# Patient Record
Sex: Female | Born: 1950 | Race: White | Hispanic: No | Marital: Married | Smoking: Never smoker
Health system: Southern US, Community
[De-identification: ages and names within clinical notes are randomized; demographics above are authoritative.]

---

## 1993-01-15 HISTORY — PX: ABDOMINAL HYSTERECTOMY: SHX81

## 1997-05-12 ENCOUNTER — Other Ambulatory Visit: Admission: RE | Admit: 1997-05-12 | Discharge: 1997-05-12 | Payer: Self-pay | Admitting: Obstetrics and Gynecology

## 1998-07-25 ENCOUNTER — Other Ambulatory Visit: Admission: RE | Admit: 1998-07-25 | Discharge: 1998-07-25 | Payer: Self-pay | Admitting: Obstetrics and Gynecology

## 1999-09-25 ENCOUNTER — Other Ambulatory Visit: Admission: RE | Admit: 1999-09-25 | Discharge: 1999-09-25 | Payer: Self-pay | Admitting: Obstetrics and Gynecology

## 2001-10-24 ENCOUNTER — Other Ambulatory Visit: Admission: RE | Admit: 2001-10-24 | Discharge: 2001-10-24 | Payer: Self-pay | Admitting: Obstetrics and Gynecology

## 2003-01-01 ENCOUNTER — Other Ambulatory Visit: Admission: RE | Admit: 2003-01-01 | Discharge: 2003-01-01 | Payer: Self-pay | Admitting: Obstetrics and Gynecology

## 2004-01-03 ENCOUNTER — Other Ambulatory Visit: Admission: RE | Admit: 2004-01-03 | Discharge: 2004-01-03 | Payer: Self-pay | Admitting: Obstetrics and Gynecology

## 2005-02-02 ENCOUNTER — Other Ambulatory Visit: Admission: RE | Admit: 2005-02-02 | Discharge: 2005-02-02 | Payer: Self-pay | Admitting: Obstetrics and Gynecology

## 2005-09-21 ENCOUNTER — Emergency Department (HOSPITAL_COMMUNITY): Admission: EM | Admit: 2005-09-21 | Discharge: 2005-09-21 | Payer: Self-pay | Admitting: Emergency Medicine

## 2006-02-19 ENCOUNTER — Other Ambulatory Visit: Admission: RE | Admit: 2006-02-19 | Discharge: 2006-02-19 | Payer: Self-pay | Admitting: Obstetrics and Gynecology
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
## 2007-04-21 ENCOUNTER — Other Ambulatory Visit: Admission: RE | Admit: 2007-04-21 | Discharge: 2007-04-21 | Payer: Self-pay | Admitting: Obstetrics and Gynecology

## 2008-04-27 ENCOUNTER — Ambulatory Visit: Payer: Self-pay | Admitting: Obstetrics and Gynecology

## 2008-04-27 ENCOUNTER — Other Ambulatory Visit: Admission: RE | Admit: 2008-04-27 | Discharge: 2008-04-27 | Payer: Self-pay | Admitting: Obstetrics and Gynecology

## 2008-04-27 ENCOUNTER — Encounter: Payer: Self-pay | Admitting: Obstetrics and Gynecology

## 2008-05-05 ENCOUNTER — Ambulatory Visit: Payer: Self-pay | Admitting: Obstetrics and Gynecology

## 2008-09-23 ENCOUNTER — Ambulatory Visit: Payer: Self-pay | Admitting: Obstetrics and Gynecology

## 2008-09-30 ENCOUNTER — Ambulatory Visit: Payer: Self-pay | Admitting: Obstetrics and Gynecology

## 2008-09-30 ENCOUNTER — Encounter: Payer: Self-pay | Admitting: Family Medicine

## 2008-11-09 ENCOUNTER — Ambulatory Visit: Payer: Self-pay | Admitting: Family Medicine

## 2008-11-09 DIAGNOSIS — E785 Hyperlipidemia, unspecified: Secondary | ICD-10-CM | POA: Insufficient documentation

## 2008-11-09 DIAGNOSIS — M6740 Ganglion, unspecified site: Secondary | ICD-10-CM | POA: Insufficient documentation

## 2009-05-02 ENCOUNTER — Other Ambulatory Visit: Admission: RE | Admit: 2009-05-02 | Discharge: 2009-05-02 | Payer: Self-pay | Admitting: Obstetrics and Gynecology

## 2009-05-02 ENCOUNTER — Ambulatory Visit: Payer: Self-pay | Admitting: Obstetrics and Gynecology

## 2009-05-05 ENCOUNTER — Ambulatory Visit: Payer: Self-pay | Admitting: Obstetrics and Gynecology

## 2009-06-02 ENCOUNTER — Ambulatory Visit: Payer: Self-pay | Admitting: Obstetrics and Gynecology

## 2009-12-12 ENCOUNTER — Ambulatory Visit: Payer: Self-pay | Admitting: Obstetrics and Gynecology

## 2010-05-22 ENCOUNTER — Other Ambulatory Visit (HOSPITAL_COMMUNITY)
Admission: RE | Admit: 2010-05-22 | Discharge: 2010-05-22 | Disposition: A | Discharge status: Home / Self Care | Payer: BC Managed Care – PPO | Source: Ambulatory Visit | Attending: Obstetrics and Gynecology | Admitting: Obstetrics and Gynecology

## 2010-05-22 ENCOUNTER — Encounter (INDEPENDENT_AMBULATORY_CARE_PROVIDER_SITE_OTHER): Payer: BC Managed Care – PPO | Admitting: Obstetrics and Gynecology

## 2010-05-22 ENCOUNTER — Other Ambulatory Visit: Payer: Self-pay | Admitting: Obstetrics and Gynecology

## 2010-05-22 DIAGNOSIS — Z01419 Encounter for gynecological examination (general) (routine) without abnormal findings: Secondary | ICD-10-CM

## 2010-05-22 DIAGNOSIS — Z13220 Encounter for screening for lipoid disorders: Secondary | ICD-10-CM

## 2010-05-22 DIAGNOSIS — Z833 Family history of diabetes mellitus: Secondary | ICD-10-CM

## 2010-05-22 DIAGNOSIS — Z124 Encounter for screening for malignant neoplasm of cervix: Secondary | ICD-10-CM | POA: Insufficient documentation

## 2010-05-23 DIAGNOSIS — Z1211 Encounter for screening for malignant neoplasm of colon: Secondary | ICD-10-CM

## 2010-08-23 ENCOUNTER — Other Ambulatory Visit: Payer: Self-pay | Admitting: *Deleted

## 2010-08-23 MED ORDER — NONFORMULARY OR COMPOUNDED ITEM: Status: DC

## 2013-04-15 ENCOUNTER — Encounter: Payer: Self-pay | Admitting: Women's Health

## 2013-04-15 ENCOUNTER — Ambulatory Visit (INDEPENDENT_AMBULATORY_CARE_PROVIDER_SITE_OTHER): Payer: BC Managed Care – PPO | Admitting: Women's Health

## 2013-04-15 VITALS — BP 130/80 | Ht 62.25 in | Wt 170.0 lb

## 2013-04-15 DIAGNOSIS — E079 Disorder of thyroid, unspecified: Secondary | ICD-10-CM

## 2013-04-15 DIAGNOSIS — Z79890 Hormone replacement therapy (postmenopausal): Secondary | ICD-10-CM

## 2013-04-15 DIAGNOSIS — Z13220 Encounter for screening for lipoid disorders: Secondary | ICD-10-CM

## 2013-04-15 DIAGNOSIS — Z01419 Encounter for gynecological examination (general) (routine) without abnormal findings: Secondary | ICD-10-CM

## 2013-04-15 LAB — CBC WITH DIFFERENTIAL/PLATELET
BASOS ABS: 0.1 10*3/uL (ref 0.0–0.1)
Basophils Relative: 1 % (ref 0–1)
Eosinophils Absolute: 0.1 10*3/uL (ref 0.0–0.7)
Eosinophils Relative: 2 % (ref 0–5)
HCT: 39.7 % (ref 36.0–46.0)
Hemoglobin: 13.6 g/dL (ref 12.0–15.0)
LYMPHS ABS: 2.3 10*3/uL (ref 0.7–4.0)
LYMPHS PCT: 39 % (ref 12–46)
MCH: 29.7 pg (ref 26.0–34.0)
MCHC: 34.3 g/dL (ref 30.0–36.0)
MCV: 86.7 fL (ref 78.0–100.0)
Monocytes Absolute: 0.3 10*3/uL (ref 0.1–1.0)
Monocytes Relative: 5 % (ref 3–12)
NEUTROS ABS: 3.2 10*3/uL (ref 1.7–7.7)
Neutrophils Relative %: 53 % (ref 43–77)
PLATELETS: 272 10*3/uL (ref 150–400)
RBC: 4.58 MIL/uL (ref 3.87–5.11)
RDW: 14.5 % (ref 11.5–15.5)
WBC: 6 10*3/uL (ref 4.0–10.5)

## 2013-04-15 MED ORDER — NONFORMULARY OR COMPOUNDED ITEM: Status: DC

## 2013-04-15 NOTE — Patient Instructions (Signed)
Health Recommendations for Postmenopausal Women Respected and ongoing research has looked at the most common causes of death, disability, and poor quality of life in postmenopausal women. The causes include heart disease, diseases of blood vessels, diabetes, depression, cancer, and bone loss (osteoporosis). Many things can be done to help lower the chances of developing these and other common problems: CARDIOVASCULAR DISEASE Heart Disease: A heart attack is a medical emergency. Know the signs and symptoms of a heart attack. Below are things women can do to reduce their risk for heart disease.   Do not smoke. If you smoke, quit.  Aim for a healthy weight. Being overweight causes many preventable deaths. Eat a healthy and balanced diet and drink an adequate amount of liquids.  Get moving. Make a commitment to be more physically active. Aim for 30 minutes of activity on most, if not all days of the week.  Eat for heart health. Choose a diet that is low in saturated fat and cholesterol and eliminate trans fat. Include whole grains, vegetables, and fruits. Read and understand the labels on food containers before buying.  Know your numbers. Ask your caregiver to check your blood pressure, cholesterol (total, HDL, LDL, triglycerides) and blood glucose. Work with your caregiver on improving your entire clinical picture.  High blood pressure. Limit or stop your table salt intake (try salt substitute and food seasonings). Avoid salty foods and drinks. Read labels on food containers before buying. Eating well and exercising can help control high blood pressure. STROKE  Stroke is a medical emergency. Stroke may be the result of a blood clot in a blood vessel in the brain or by a brain hemorrhage (bleeding). Know the signs and symptoms of a stroke. To lower the risk of developing a stroke:  Avoid fatty foods.  Quit smoking.  Control your diabetes, blood pressure, and irregular heart rate. THROMBOPHLEBITIS  (BLOOD CLOT) OF THE LEG  Becoming overweight and leading a stationary lifestyle may also contribute to developing blood clots. Controlling your diet and exercising will help lower the risk of developing blood clots. CANCER SCREENING  Breast Cancer: Take steps to reduce your risk of breast cancer.  You should practice "breast self-awareness." This means understanding the normal appearance and feel of your breasts and should include breast self-examination. Any changes detected, no matter how small, should be reported to your caregiver.  After age 40, you should have a clinical breast exam (CBE) every year.  Starting at age 40, you should consider having a mammogram (breast X-ray) every year.  If you have a family history of breast cancer, talk to your caregiver about genetic screening.  If you are at high risk for breast cancer, talk to your caregiver about having an MRI and a mammogram every year.  Intestinal or Stomach Cancer: Tests to consider are a rectal exam, fecal occult blood, sigmoidoscopy, and colonoscopy. Women who are high risk may need to be screened at an earlier age and more often.  Cervical Cancer:  Beginning at age 30, you should have a Pap test every 3 years as long as the past 3 Pap tests have been normal.  If you have had past treatment for cervical cancer or a condition that could lead to cancer, you need Pap tests and screening for cancer for at least 20 years after your treatment.  If you had a hysterectomy for a problem that was not cancer or a condition that could lead to cancer, then you no longer need Pap tests.    If you are between ages 65 and 70, and you have had normal Pap tests going back 10 years, you no longer need Pap tests.  If Pap tests have been discontinued, risk factors (such as a new sexual partner) need to be reassessed to determine if screening should be resumed.  Some medical problems can increase the chance of getting cervical cancer. In these  cases, your caregiver may recommend more frequent screening and Pap tests.  Uterine Cancer: If you have vaginal bleeding after reaching menopause, you should notify your caregiver.  Ovarian cancer: Other than yearly pelvic exams, there are no reliable tests available to screen for ovarian cancer at this time except for yearly pelvic exams.  Lung Cancer: Yearly chest X-rays can detect lung cancer and should be done on high risk women, such as cigarette smokers and women with chronic lung disease (emphysema).  Skin Cancer: A complete body skin exam should be done at your yearly examination. Avoid overexposure to the sun and ultraviolet light lamps. Use a strong sun block cream when in the sun. All of these things are important in lowering the risk of skin cancer. MENOPAUSE Menopause Symptoms: Hormone therapy products are effective for treating symptoms associated with menopause:  Moderate to severe hot flashes.  Night sweats.  Mood swings.  Headaches.  Tiredness.  Loss of sex drive.  Insomnia.  Other symptoms. Hormone replacement carries certain risks, especially in older women. Women who use or are thinking about using estrogen or estrogen with progestin treatments should discuss that with their caregiver. Your caregiver will help you understand the benefits and risks. The ideal dose of hormone replacement therapy is not known. The Food and Drug Administration (FDA) has concluded that hormone therapy should be used only at the lowest doses and for the shortest amount of time to reach treatment goals.  OSTEOPOROSIS Protecting Against Bone Loss and Preventing Fracture: If you use hormone therapy for prevention of bone loss (osteoporosis), the risks for bone loss must outweigh the risk of the therapy. Ask your caregiver about other medications known to be safe and effective for preventing bone loss and fractures. To guard against bone loss or fractures, the following is recommended:  If  you are less than age 50, take 1000 mg of calcium and at least 600 mg of Vitamin D per day.  If you are greater than age 50 but less than age 70, take 1200 mg of calcium and at least 600 mg of Vitamin D per day.  If you are greater than age 70, take 1200 mg of calcium and at least 800 mg of Vitamin D per day. Smoking and excessive alcohol intake increases the risk of osteoporosis. Eat foods rich in calcium and vitamin D and do weight bearing exercises several times a week as your caregiver suggests. DIABETES Diabetes Melitus: If you have Type I or Type 2 diabetes, you should keep your blood sugar under control with diet, exercise and recommended medication. Avoid too many sweets, starchy and fatty foods. Being overweight can make control more difficult. COGNITION AND MEMORY Cognition and Memory: Menopausal hormone therapy is not recommended for the prevention of cognitive disorders such as Alzheimer's disease or memory loss.  DEPRESSION  Depression may occur at any age, but is common in elderly women. The reasons may be because of physical, medical, social (loneliness), or financial problems and needs. If you are experiencing depression because of medical problems and control of symptoms, talk to your caregiver about this. Physical activity and   exercise may help with mood and sleep. Community and volunteer involvement may help your sense of value and worth. If you have depression and you feel that the problem is getting worse or becoming severe, talk to your caregiver about treatment options that are best for you. ACCIDENTS  Accidents are common and can be serious in the elderly woman. Prepare your house to prevent accidents. Eliminate throw rugs, place hand bars in the bath, shower and toilet areas. Avoid wearing high heeled shoes or walking on wet, snowy, and icy areas. Limit or stop driving if you have vision or hearing problems, or you feel you are unsteady with you movements and  reflexes. HEPATITIS C Hepatitis C is a type of viral infection affecting the liver. It is spread mainly through contact with blood from an infected person. It can be treated, but if left untreated, it can lead to severe liver damage over years. Many people who are infected do not know that the virus is in their blood. If you are a "baby-boomer", it is recommended that you have one screening test for Hepatitis C. IMMUNIZATIONS  Several immunizations are important to consider having during your senior years, including:   Tetanus, diptheria, and pertussis booster shot.  Influenza every year before the flu season begins.  Pneumonia vaccine.  Shingles vaccine.  Others as indicated based on your specific needs. Talk to your caregiver about these. Document Released: 02/23/2005 Document Revised: 12/19/2011 Document Reviewed: 10/20/2007 [employer] Patient Information 2014 [employer].  

## 2013-04-15 NOTE — Progress Notes (Signed)
Brenda Myers 09/11/1950 [id number]    History:    Presents for annual exam.  Postmenopausal on estradiol cream. TAH with LSO in 1992, RSO 99 benign ovarian cysts. Normal Pap history, normal mammograms after ultrasounds. 2011 DEXA T score 2.3 bilateral hip and 2.2 AP spine. Negative colonoscopy 2013. History of benign hematuria evaluated by Dr. Earlene Plateravis 2004.  Past medical history, past surgical history, family history and social history were all reviewed and documented in the EPIC chart. [place] horses[place]  ROS:  A  ROS was performed and pertinent positives and negatives are included.  Exam:  Filed Vitals:   04/15/13 0946  BP: 130/80    General appearance:  Normal Thyroid:  Symmetrical, normal in size, without palpable masses or nodularity. Respiratory  Auscultation:  Clear without wheezing or rhonchi Cardiovascular  Auscultation:  Regular rate, without rubs, murmurs or gallops  Edema/varicosities:  Not grossly evident Abdominal  Soft,nontender, without masses, guarding or rebound.  Liver/spleen:  No organomegaly noted  Hernia:  None appreciated  Skin  Inspection:  Grossly normal   Breasts: Examined lying and sitting.     Right: Without masses, retractions, discharge or axillary adenopathy.     Left: Without masses, retractions, discharge or axillary adenopathy. Gentitourinary   Inguinal/mons:  Normal without inguinal adenopathy  External genitalia:  Normal  BUS/Urethra/Skene's glands:  Normal  Vagina:  Normal  Cervix:   absent   Uterus:   absent   Adnexa/parametria:     Rt: Without masses or tenderness.   Lt: Without masses or tenderness.  Anus and perineum: Normal  Digital rectal exam: Normal sphincter tone without palpated masses or tenderness  Assessment/Plan:  63 y.o. MWF G0 for annual exam.    TAH with BSO on estradiol vaginal cream Normal DEXA  Plan: Estradiol .02% cream vaginally twice weekly prescription, proper use  given and reviewed risk for blood clots, strokes, breast cancer. SBE's, continue annual mammogram, calcium rich diet, vitamin D 2000 daily encouraged. Zostavac  Recommended. Home safety and fall prevention discussed. CBC, comprehensive metabolic panel, lipid panel, TSH, UA, Pap not done, screenings reviewed.     Harrington ChallengerYOUNG,NANCY J Va Montana Healthcare SystemWHNP, 10:38 AM 04/15/2013

## 2013-04-16 ENCOUNTER — Encounter: Payer: Self-pay | Admitting: Obstetrics and Gynecology

## 2013-04-16 ENCOUNTER — Encounter: Payer: Self-pay | Admitting: Women's Health

## 2013-04-16 LAB — URINALYSIS W MICROSCOPIC + REFLEX CULTURE
Bilirubin Urine: NEGATIVE
CRYSTALS: NONE SEEN
Casts: NONE SEEN
GLUCOSE, UA: NEGATIVE mg/dL
Hgb urine dipstick: NEGATIVE
Ketones, ur: NEGATIVE mg/dL
LEUKOCYTES UA: NEGATIVE
Nitrite: NEGATIVE
PH: 7.5 (ref 5.0–8.0)
PROTEIN: NEGATIVE mg/dL
SPECIFIC GRAVITY, URINE: 1.021 (ref 1.005–1.030)
SQUAMOUS EPITHELIAL / LPF: NONE SEEN
Urobilinogen, UA: 0.2 mg/dL (ref 0.0–1.0)

## 2013-04-16 LAB — COMPREHENSIVE METABOLIC PANEL
ALBUMIN: 4.4 g/dL (ref 3.5–5.2)
ALT: 22 U/L (ref 0–35)
AST: 20 U/L (ref 0–37)
Alkaline Phosphatase: 51 U/L (ref 39–117)
BUN: 17 mg/dL (ref 6–23)
CALCIUM: 9.3 mg/dL (ref 8.4–10.5)
CHLORIDE: 104 meq/L (ref 96–112)
CO2: 25 meq/L (ref 19–32)
Creat: 0.76 mg/dL (ref 0.50–1.10)
Glucose, Bld: 96 mg/dL (ref 70–99)
POTASSIUM: 4.4 meq/L (ref 3.5–5.3)
SODIUM: 138 meq/L (ref 135–145)
TOTAL PROTEIN: 7.6 g/dL (ref 6.0–8.3)
Total Bilirubin: 0.6 mg/dL (ref 0.2–1.2)

## 2013-04-16 LAB — LIPID PANEL
CHOL/HDL RATIO: 3.4 ratio
Cholesterol: 245 mg/dL — ABNORMAL HIGH (ref 0–200)
HDL: 73 mg/dL (ref >39)
LDL Cholesterol: 143 mg/dL — ABNORMAL HIGH (ref 0–99)
Triglycerides: 144 mg/dL (ref <150)
VLDL: 29 mg/dL (ref 0–40)

## 2013-04-16 LAB — TSH: TSH: 2.766 u[IU]/mL (ref 0.350–4.500)

## 2013-04-20 ENCOUNTER — Other Ambulatory Visit: Payer: Self-pay | Admitting: Gynecology

## 2013-04-20 DIAGNOSIS — E7800 Pure hypercholesterolemia, unspecified: Secondary | ICD-10-CM

## 2014-04-20 ENCOUNTER — Encounter: Payer: Self-pay | Admitting: Women's Health

## 2014-04-21 ENCOUNTER — Encounter: Payer: Self-pay | Admitting: Women's Health

## 2014-05-19 ENCOUNTER — Encounter: Payer: Self-pay | Admitting: Women's Health

## 2014-06-02 ENCOUNTER — Other Ambulatory Visit: Payer: Self-pay

## 2014-06-02 DIAGNOSIS — Z79890 Hormone replacement therapy (postmenopausal): Secondary | ICD-10-CM

## 2014-06-10 ENCOUNTER — Other Ambulatory Visit: Payer: Self-pay | Admitting: *Deleted

## 2014-06-10 DIAGNOSIS — Z79890 Hormone replacement therapy (postmenopausal): Secondary | ICD-10-CM

## 2014-06-10 MED ORDER — NONFORMULARY OR COMPOUNDED ITEM: Status: DC

## 2014-06-25 ENCOUNTER — Encounter: Payer: Self-pay | Admitting: Women's Health

## 2015-04-21 ENCOUNTER — Encounter: Payer: Self-pay | Admitting: Women's Health

## 2015-05-03 ENCOUNTER — Encounter: Payer: Self-pay | Admitting: Women's Health

## 2015-07-20 ENCOUNTER — Encounter: Payer: Self-pay | Admitting: Women's Health

## 2016-04-25 ENCOUNTER — Encounter: Payer: Self-pay | Admitting: Women's Health

## 2016-04-25 ENCOUNTER — Ambulatory Visit (INDEPENDENT_AMBULATORY_CARE_PROVIDER_SITE_OTHER): Payer: Medicare Other | Admitting: Women's Health

## 2016-04-25 VITALS — BP 142/86 | Ht 63.0 in | Wt 170.0 lb

## 2016-04-25 DIAGNOSIS — E2839 Other primary ovarian failure: Secondary | ICD-10-CM

## 2016-04-25 DIAGNOSIS — Z01419 Encounter for gynecological examination (general) (routine) without abnormal findings: Secondary | ICD-10-CM

## 2016-04-25 NOTE — Patient Instructions (Signed)
Health Maintenance for Postmenopausal Women Menopause is a normal process in which your reproductive ability comes to an end. This process happens gradually over a span of months to years, usually between the ages of 33 and 38. Menopause is complete when you have missed 12 consecutive menstrual periods. It is important to talk with your health care provider about some of the most common conditions that affect postmenopausal women, such as heart disease, cancer, and bone loss (osteoporosis). Adopting a healthy lifestyle and getting preventive care can help to promote your health and wellness. Those actions can also lower your chances of developing some of these common conditions. What should I know about menopause? During menopause, you may experience a number of symptoms, such as:  Moderate-to-severe hot flashes.  Night sweats.  Decrease in sex drive.  Mood swings.  Headaches.  Tiredness.  Irritability.  Memory problems.  Insomnia. Choosing to treat or not to treat menopausal changes is an individual decision that you make with your health care provider. What should I know about hormone replacement therapy and supplements? Hormone therapy products are effective for treating symptoms that are associated with menopause, such as hot flashes and night sweats. Hormone replacement carries certain risks, especially as you become older. If you are thinking about using estrogen or estrogen with progestin treatments, discuss the benefits and risks with your health care provider. What should I know about heart disease and stroke? Heart disease, heart attack, and stroke become more likely as you age. This may be due, in part, to the hormonal changes that your body experiences during menopause. These can affect how your body processes dietary fats, triglycerides, and cholesterol. Heart attack and stroke are both medical emergencies. There are many things that you can do to help prevent heart disease  and stroke:  Have your blood pressure checked at least every 1-2 years. High blood pressure causes heart disease and increases the risk of stroke.  If you are 48-61 years old, ask your health care provider if you should take aspirin to prevent a heart attack or a stroke.  Do not use any tobacco products, including cigarettes, chewing tobacco, or electronic cigarettes. If you need help quitting, ask your health care provider.  It is important to eat a healthy diet and maintain a healthy weight.  Be sure to include plenty of vegetables, fruits, low-fat dairy products, and lean protein.  Avoid eating foods that are high in solid fats, added sugars, or salt (sodium).  Get regular exercise. This is one of the most important things that you can do for your health.  Try to exercise for at least 150 minutes each week. The type of exercise that you do should increase your heart rate and make you sweat. This is known as moderate-intensity exercise.  Try to do strengthening exercises at least twice each week. Do these in addition to the moderate-intensity exercise.  Know your numbers.Ask your health care provider to check your cholesterol and your blood glucose. Continue to have your blood tested as directed by your health care provider. What should I know about cancer screening? There are several types of cancer. Take the following steps to reduce your risk and to catch any cancer development as early as possible. Breast Cancer  Practice breast self-awareness.  This means understanding how your breasts normally appear and feel.  It also means doing regular breast self-exams. Let your health care provider know about any changes, no matter how small.  If you are 40 or older,  have a clinician do a breast exam (clinical breast exam or CBE) every year. Depending on your age, family history, and medical history, it may be recommended that you also have a yearly breast X-ray (mammogram).  If you  have a family history of breast cancer, talk with your health care provider about genetic screening.  If you are at high risk for breast cancer, talk with your health care provider about having an MRI and a mammogram every year.  Breast cancer (BRCA) gene test is recommended for women who have family members with BRCA-related cancers. Results of the assessment will determine the need for genetic counseling and BRCA1 and for BRCA2 testing. BRCA-related cancers include these types:  Breast. This occurs in males or females.  Ovarian.  Tubal. This may also be called fallopian tube cancer.  Cancer of the abdominal or pelvic lining (peritoneal cancer).  Prostate.  Pancreatic. Cervical, Uterine, and Ovarian Cancer  Your health care provider may recommend that you be screened regularly for cancer of the pelvic organs. These include your ovaries, uterus, and vagina. This screening involves a pelvic exam, which includes checking for microscopic changes to the surface of your cervix (Pap test).  For women ages 21-65, health care providers may recommend a pelvic exam and a Pap test every three years. For women ages 23-65, they may recommend the Pap test and pelvic exam, combined with testing for human papilloma virus (HPV), every five years. Some types of HPV increase your risk of cervical cancer. Testing for HPV may also be done on women of any age who have unclear Pap test results.  Other health care providers may not recommend any screening for nonpregnant women who are considered low risk for pelvic cancer and have no symptoms. Ask your health care provider if a screening pelvic exam is right for you.  If you have had past treatment for cervical cancer or a condition that could lead to cancer, you need Pap tests and screening for cancer for at least 20 years after your treatment. If Pap tests have been discontinued for you, your risk factors (such as having a new sexual partner) need to be reassessed  to determine if you should start having screenings again. Some women have medical problems that increase the chance of getting cervical cancer. In these cases, your health care provider may recommend that you have screening and Pap tests more often.  If you have a family history of uterine cancer or ovarian cancer, talk with your health care provider about genetic screening.  If you have vaginal bleeding after reaching menopause, tell your health care provider.  There are currently no reliable tests available to screen for ovarian cancer. Lung Cancer  Lung cancer screening is recommended for adults 99-83 years old who are at high risk for lung cancer because of a history of smoking. A yearly low-dose CT scan of the lungs is recommended if you:  Currently smoke.  Have a history of at least 30 pack-years of smoking and you currently smoke or have quit within the past 15 years. A pack-year is smoking an average of one pack of cigarettes per day for one year. Yearly screening should:  Continue until it has been 15 years since you quit.  Stop if you develop a health problem that would prevent you from having lung cancer treatment. Colorectal Cancer  This type of cancer can be detected and can often be prevented.  Routine colorectal cancer screening usually begins at age 72 and continues  through age 75.  If you have risk factors for colon cancer, your health care provider may recommend that you be screened at an earlier age.  If you have a family history of colorectal cancer, talk with your health care provider about genetic screening.  Your health care provider may also recommend using home test kits to check for hidden blood in your stool.  A small camera at the end of a tube can be used to examine your colon directly (sigmoidoscopy or colonoscopy). This is done to check for the earliest forms of colorectal cancer.  Direct examination of the colon should be repeated every 5-10 years until  age 75. However, if early forms of precancerous polyps or small growths are found or if you have a family history or genetic risk for colorectal cancer, you may need to be screened more often. Skin Cancer  Check your skin from head to toe regularly.  Monitor any moles. Be sure to tell your health care provider:  About any new moles or changes in moles, especially if there is a change in a mole's shape or color.  If you have a mole that is larger than the size of a pencil eraser.  If any of your family members has a history of skin cancer, especially at a [place] age, talk with your health care provider about genetic screening.  Always use sunscreen. Apply sunscreen liberally and repeatedly throughout the day.  Whenever you are outside, protect yourself by wearing long sleeves, pants, a wide-brimmed hat, and sunglasses. What should I know about osteoporosis? Osteoporosis is a condition in which bone destruction happens more quickly than new bone creation. After menopause, you may be at an increased risk for osteoporosis. To help prevent osteoporosis or the bone fractures that can happen because of osteoporosis, the following is recommended:  If you are 19-50 years old, get at least 1,000 mg of calcium and at least 600 mg of vitamin D per day.  If you are older than age 50 but younger than age 70, get at least 1,200 mg of calcium and at least 600 mg of vitamin D per day.  If you are older than age 70, get at least 1,200 mg of calcium and at least 800 mg of vitamin D per day. Smoking and excessive alcohol intake increase the risk of osteoporosis. Eat foods that are rich in calcium and vitamin D, and do weight-bearing exercises several times each week as directed by your health care provider. What should I know about how menopause affects my mental health? Depression may occur at any age, but it is more common as you become older. Common symptoms of depression include:  Low or sad  mood.  Changes in sleep patterns.  Changes in appetite or eating patterns.  Feeling an overall lack of motivation or enjoyment of activities that you previously enjoyed.  Frequent crying spells. Talk with your health care provider if you think that you are experiencing depression. What should I know about immunizations? It is important that you get and maintain your immunizations. These include:  Tetanus, diphtheria, and pertussis (Tdap) booster vaccine.  Influenza every year before the flu season begins.  Pneumonia vaccine.  Shingles vaccine. Your health care provider may also recommend other immunizations. This information is not intended to replace advice given to you by your health care provider. Make sure you discuss any questions you have with your health care provider. Document Released: 02/23/2005 Document Revised: 07/22/2015 Document Reviewed: 10/05/2014 [employer] Interactive Patient   Education  2017 [employer]  

## 2016-04-25 NOTE — Progress Notes (Signed)
Debara Kamphuis 12/19/1950 [id number]    History:    Presents for breast and pelvic exam with no complaints. Postmenopausal/no HRT/stopped estradiol cream. Dry eye relief omega T once daily and aloe lubricant for vaginal dryness and irritation. TAH with LSO in 1992, RSO 99 benign ovarian cysts. Normal pap and mammography history. 2011 DEXA T score 2.3 bilateral hip and 2.2 AP spine. 2013 negative colonoscopy.   Past medical history, past surgical history, family history and social history were all reviewed and documented in the EPIC chart. Retired, married. Has 2 horses currently not sexually active husbands health.  ROS:  A ROS was performed and pertinent positives and negatives are included.  Exam:  Vitals:   04/25/16 1026  BP: (!) 142/86  Weight: 170 lb (77.1 kg)  Height:  (1.6 m)   Body mass index is 30.11 kg/m.   General appearance:  Normal Thyroid:  Symmetrical, normal in size, without palpable masses or nodularity. Respiratory  Auscultation:  Clear without wheezing or rhonchi Cardiovascular  Auscultation:  Regular rate, without rubs, murmurs or gallops  Edema/varicosities:  Not grossly evident Abdominal  Soft,nontender, without masses, guarding or rebound.  Liver/spleen:  No organomegaly noted  Hernia:  None appreciated  Skin  Inspection:  Grossly normal   Breasts: Examined lying and sitting.     Right: Without masses, retractions, discharge or axillary adenopathy.     Left: Without masses, retractions, discharge or axillary adenopathy. Gentitourinary   Inguinal/mons:  Normal without inguinal adenopathy  External genitalia:  Normal  BUS/Urethra/Skene's glands:  Normal  Vagina:  Normal  Cervix:  Absent  Uterus:  Absent  Adnexa/parametria:     Rt: Without masses or tenderness.   Lt: Without masses or tenderness.  Anus and perineum: Normal  Digital rectal exam: Normal sphincter tone without palpated masses or tenderness  Assessment/Plan:  66 y.o. MWF G0  for Breast and pelvic exam with no complaints.  Postmenopausal/no HRT  TAH with BSO Blood pressure elevated today  Plan: Continue omega T and aloe PRN. SBE's, continue annual mammogram, calcium rich diet, vitamin D 2000 daily encouraged. DEXA scan encouraged. Zostavax and Pneumovax recommended. Home safety and fall prevention discussed. Blood work managed at PCP. Instructed to monitor high blood pressure follow-up with primary care continues greater than 130/80. Pap screening guidelines reviewed.Marland Kitchen     Harrington Challenger WHNP, 11:09 AM 04/25/2016

## 2016-05-30 ENCOUNTER — Encounter: Payer: Self-pay | Admitting: Gynecology

## 2017-06-11 ENCOUNTER — Telehealth: Payer: Self-pay | Admitting: *Deleted

## 2017-06-11 NOTE — Telephone Encounter (Signed)
Patient called and left message to call her at [phone], I called this number but the rang several times with no voicemail.

## 2017-08-07 ENCOUNTER — Ambulatory Visit (INDEPENDENT_AMBULATORY_CARE_PROVIDER_SITE_OTHER): Payer: Medicare PPO | Admitting: Gynecology

## 2017-08-07 ENCOUNTER — Encounter: Payer: Self-pay | Admitting: Gynecology

## 2017-08-07 VITALS — BP 124/80 | Ht 63.0 in | Wt 173.0 lb

## 2017-08-07 DIAGNOSIS — N952 Postmenopausal atrophic vaginitis: Secondary | ICD-10-CM

## 2017-08-07 DIAGNOSIS — Z01419 Encounter for gynecological examination (general) (routine) without abnormal findings: Secondary | ICD-10-CM

## 2017-08-07 DIAGNOSIS — N941 Unspecified dyspareunia: Secondary | ICD-10-CM | POA: Diagnosis not present

## 2017-08-07 NOTE — Progress Notes (Signed)
    Brenda Myers Jun 03, 1950 [id number]        67 y.o.  G0P0000 for breast and pelvic exam.  Complaining of vaginal dryness and pain with intercourse.  Had used vaginal estrogen cream in the past with good results but discontinued and now finds it uncomfortable.  That is post TAH LSO in 1992 with subsequent RSO 1999 for benign indications.  Past medical history,surgical history, problem list, medications, allergies, family history and social history were all reviewed and documented as reviewed in the EPIC chart.  ROS:  Performed with pertinent positives and negatives included in the history, assessment and plan.   Additional significant findings : None   Exam: Kennon PortelaKim Gardner assistant Vitals:   08/07/17 1014  BP: 124/80  Weight: 173 lb (78.5 kg)  Height: 5\' 3"  (1.6 m)   Body mass index is 30.65 kg/m.  General appearance:  Normal affect, orientation and appearance. Skin: Grossly normal HEENT: Without gross lesions.  No cervical or supraclavicular adenopathy. Thyroid normal.  Lungs:  Clear without wheezing, rales or rhonchi Cardiac: RR, without RMG Abdominal:  Soft, nontender, without masses, guarding, rebound, organomegaly or hernia Breasts:  Examined lying and sitting without masses, retractions, discharge or axillary adenopathy. Pelvic:  Ext, BUS, Vagina: With atrophic changes  Adnexa: Without masses or tenderness    Anus and perineum: Normal   Rectovaginal: Normal sphincter tone without palpated masses or tenderness.    Assessment/Plan:  67 y.o. G0P0000 female for breast and pelvic exam.   1. Postmenopausal/vaginal atrophy.  Patient with vaginal dryness and dyspareunia.  Had been on vaginal estradiol cream in the past but discontinued.  We discussed options to include OTC products versus vaginal estrogen.  Risks of absorption with systemic effects to include thrombosis such as stroke heart attack DVT and breast cancer issues discussed.  Patient wants to go ahead and  reinitiate.  Various options to include vaginal tablets, cream and rings discussed.  Ultimately we decided on [facility] formulated vaginal estradiol cream twice weekly.  Refill x1 year provided.  Patient will go ahead and start and follow-up with me if she has any issues. 2. Mammography 04/2017.  Continue with annual mammography when due.  Breast exam normal today. 3. DEXA 2011 normal.  Recommend follow-up DEXA now as she is 66 and she agrees to schedule. 4. Colonoscopy 2013.  Repeat at their recommended interval. 5. Pap smear 2012.  No Pap smear done today.  Discussed current screening guidelines and we both agree to stop screening based on age and hysterectomy history.  No history of abnormal Pap smears previously. 6. House maintenance.  No routine lab work done.  Patient is going to establish care with a primary physician and follow-up with them routinely.  Follow-up for DEXA now.  Follow-up in 1 year for annual gynecologic exam.  Additional time in excess of her breast and pelvic exam was spent in direct face to face counseling and coordination of care in regards to her vaginal dryness/dyspareunia with vaginal estrogen prescription provided.    Dara Lordsimothy P Laron Angelini MD, 11:25 AM 08/07/2017

## 2017-08-07 NOTE — Patient Instructions (Signed)
Start on the vaginal estrogen cream.  Call me if you have any issues.

## 2017-08-08 ENCOUNTER — Telehealth: Payer: Self-pay | Admitting: *Deleted

## 2017-08-08 MED ORDER — NONFORMULARY OR COMPOUNDED ITEM
3 refills | Status: AC
Start: 2017-08-08 — End: ?

## 2017-08-08 NOTE — Telephone Encounter (Signed)
-----   Message from Dara Lordsimothy P Fontaine, MD sent at 08/07/2017 11:06 AM EDT ----- [facility] vaginal estradiol prefilled syringes twice weekly 4619-month supply refill x1 year

## 2017-08-08 NOTE — Telephone Encounter (Signed)
Rx called in 

## 2018-10-22 ENCOUNTER — Encounter: Payer: Self-pay | Admitting: Gynecology

## 2021-08-03 ENCOUNTER — Other Ambulatory Visit: Payer: Self-pay
